# Patient Record
Sex: Male | Born: 1965 | Race: Black or African American | Hispanic: No | Marital: Single | Smoking: Current every day smoker
Health system: Southern US, Community
[De-identification: ages and names within clinical notes are randomized; demographics above are authoritative.]

---

## 2016-06-12 ENCOUNTER — Emergency Department: Payer: No Typology Code available for payment source

## 2016-06-12 ENCOUNTER — Emergency Department
Admission: EM | Admit: 2016-06-12 | Discharge: 2016-06-12 | Disposition: A | Discharge status: Home / Self Care | Payer: No Typology Code available for payment source | Attending: Emergency Medicine | Admitting: Emergency Medicine

## 2016-06-12 ENCOUNTER — Encounter: Payer: Self-pay | Admitting: *Deleted

## 2016-06-12 DIAGNOSIS — Y92410 Unspecified street and highway as the place of occurrence of the external cause: Secondary | ICD-10-CM | POA: Diagnosis not present

## 2016-06-12 DIAGNOSIS — S299XXA Unspecified injury of thorax, initial encounter: Secondary | ICD-10-CM | POA: Diagnosis present

## 2016-06-12 DIAGNOSIS — Y999 Unspecified external cause status: Secondary | ICD-10-CM | POA: Insufficient documentation

## 2016-06-12 DIAGNOSIS — F17210 Nicotine dependence, cigarettes, uncomplicated: Secondary | ICD-10-CM | POA: Diagnosis not present

## 2016-06-12 DIAGNOSIS — S20212A Contusion of left front wall of thorax, initial encounter: Secondary | ICD-10-CM | POA: Diagnosis not present

## 2016-06-12 DIAGNOSIS — Y9389 Activity, other specified: Secondary | ICD-10-CM | POA: Diagnosis not present

## 2016-06-12 MED ORDER — TRAMADOL HCL 50 MG PO TABS: 50.0000 mg | ORAL_TABLET | Freq: Two times a day (BID) | ORAL | 0 refills | Status: AC | PRN
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
